# Patient Record
Sex: Female | Born: 1980 | Race: White | Hispanic: Yes | Marital: Married | Smoking: Never smoker
Health system: Southern US, Community
[De-identification: ages and names within clinical notes are randomized; demographics above are authoritative.]

---

## 2005-11-13 ENCOUNTER — Ambulatory Visit (HOSPITAL_COMMUNITY): Admission: RE | Admit: 2005-11-13 | Discharge: 2005-11-13 | Payer: Self-pay | Admitting: Family Medicine

## 2006-03-15 ENCOUNTER — Ambulatory Visit: Payer: Self-pay | Admitting: Obstetrics and Gynecology

## 2006-03-15 ENCOUNTER — Inpatient Hospital Stay (HOSPITAL_COMMUNITY): Admission: AD | Admit: 2006-03-15 | Discharge: 2006-03-17 | Payer: Self-pay | Admitting: Family Medicine

## 2008-09-07 ENCOUNTER — Ambulatory Visit (HOSPITAL_COMMUNITY): Admission: RE | Admit: 2008-09-07 | Discharge: 2008-09-07 | Payer: Self-pay | Admitting: Family Medicine

## 2009-01-24 ENCOUNTER — Ambulatory Visit: Payer: Self-pay | Admitting: Family

## 2009-01-24 ENCOUNTER — Inpatient Hospital Stay (HOSPITAL_COMMUNITY): Admission: AD | Admit: 2009-01-24 | Discharge: 2009-01-26 | Payer: Self-pay | Admitting: Obstetrics & Gynecology

## 2010-06-14 IMAGING — US US OB DETAIL+14 WK
3 series · 14 of 28 positions shown · non-contrast
Comparison: none

OBSTETRICAL ULTRASOUND:
 This ultrasound exam was performed in the [HOSPITAL] Ultrasound Department.  The OB US report was generated in the AS system, and faxed to the ordering physician.  This report is also available in [REDACTED] PACS.

[Series 1: us ob detail +14 wk · 0.14mm/px · 1 of 12 slices shown (1 of 3)]
[im 6/12]
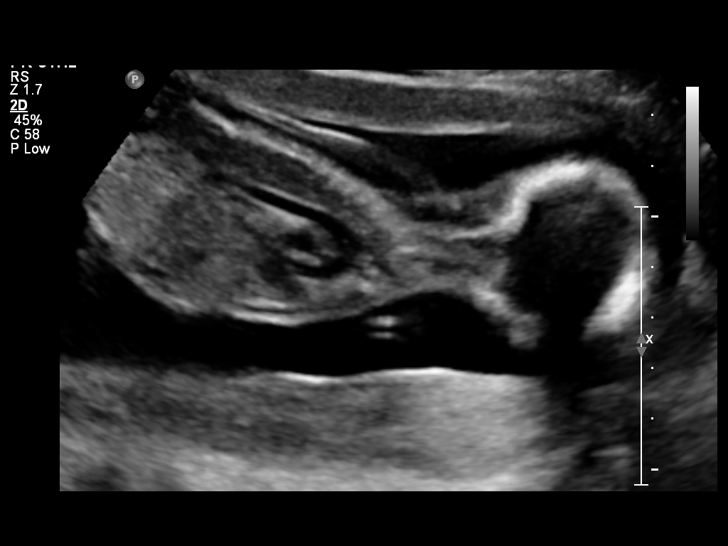

[Series 1: us ob detail +14 wk · 0.16mm/px · 2 of 15 slices shown (2 of 3)]
[im 1/15]
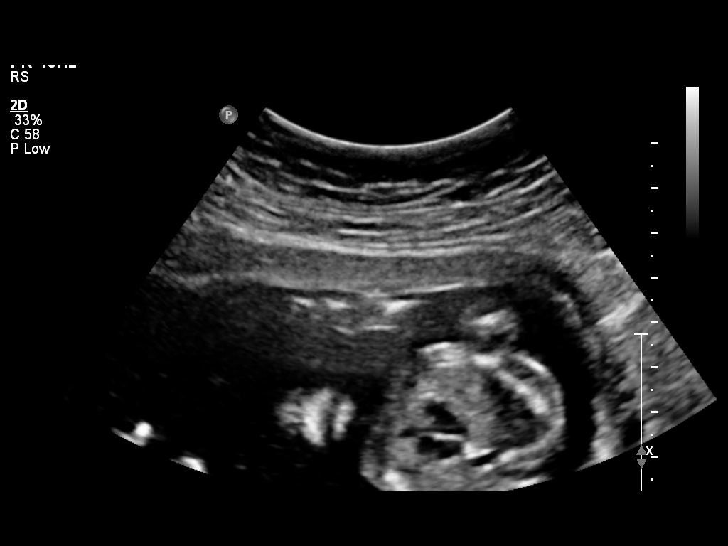
[im 10/15]
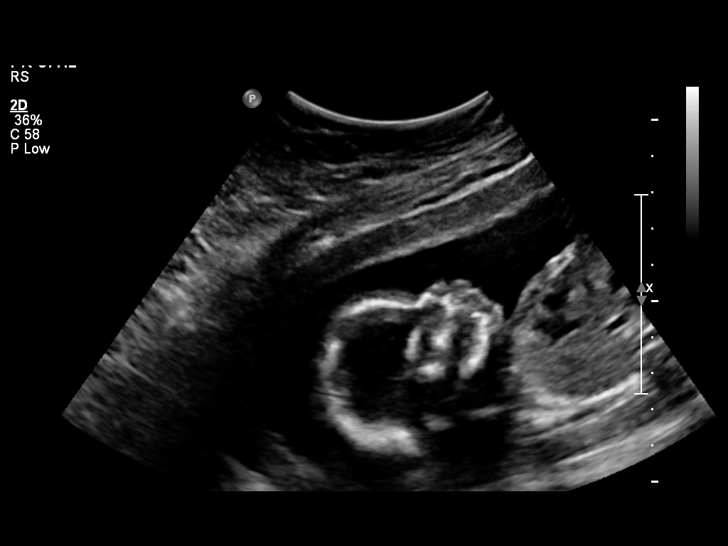

[Series 1: us ob detail +14 wk · 0.24mm/px · 78 acquisitions, 11 frames shown (3 of 3)]
[im 1/78]
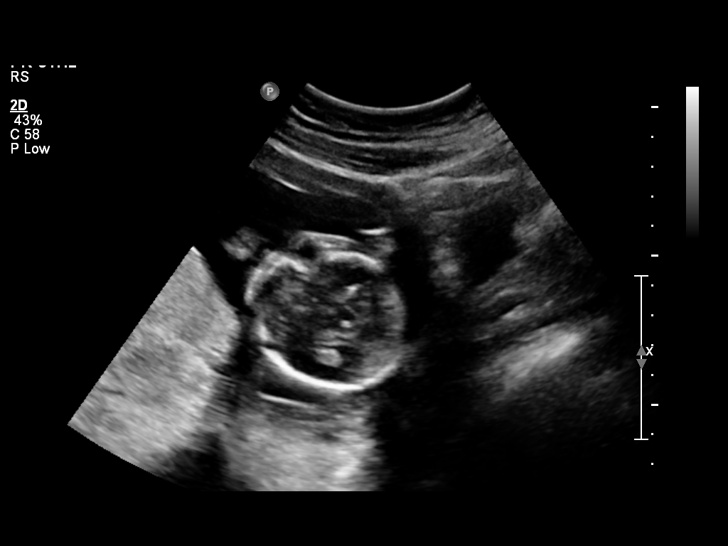
[im 8/78]
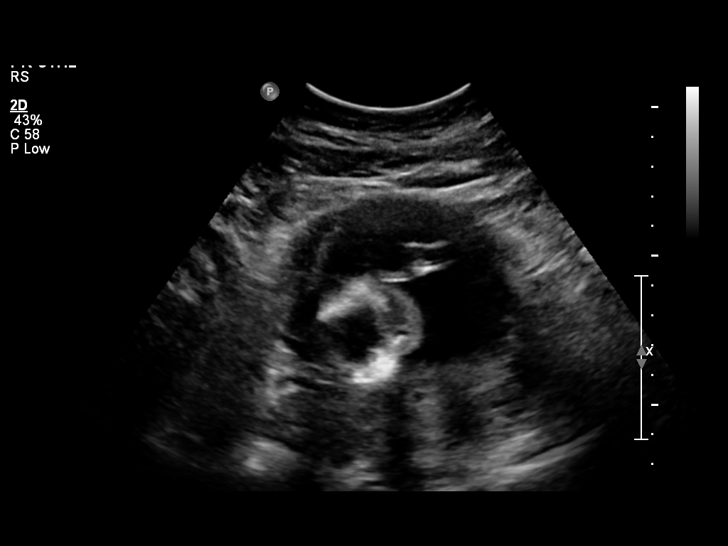
[im 16/78]
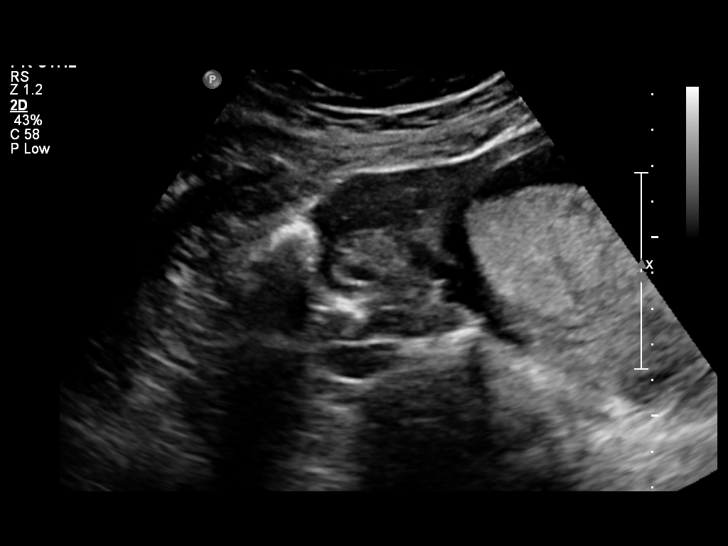
[im 24/78]
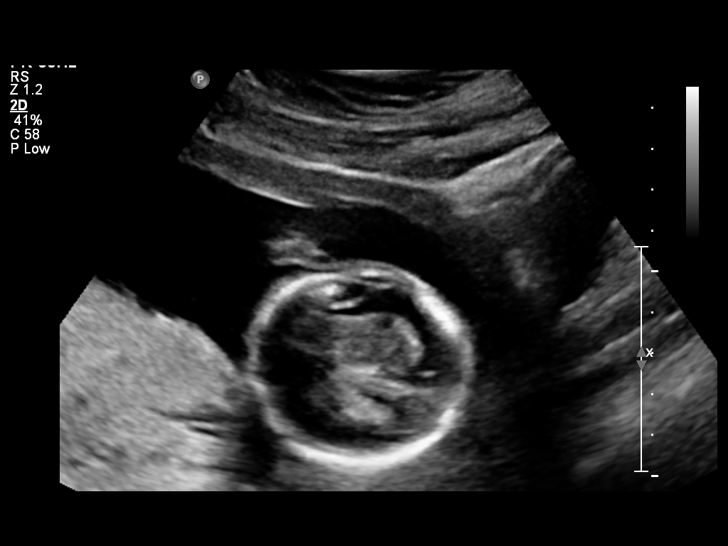
[im 31/78]
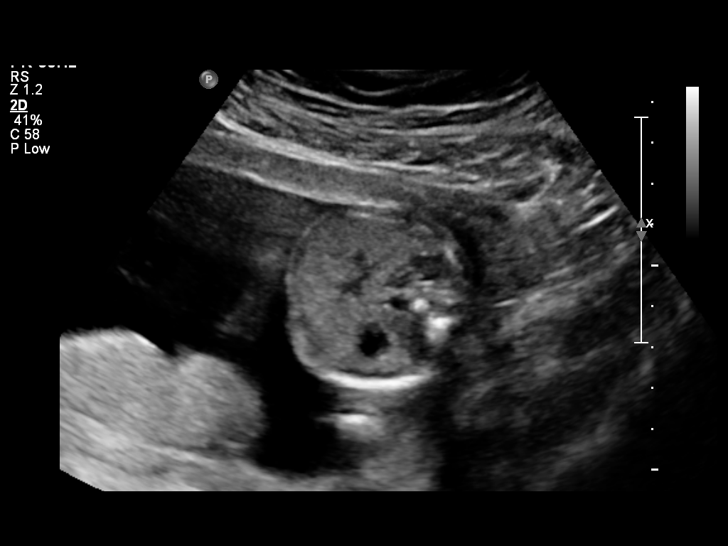
[im 39/78]
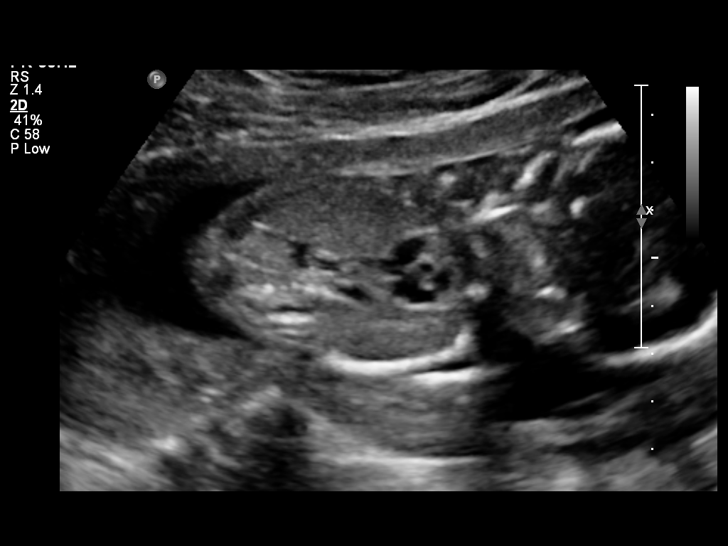
[im 47/78]
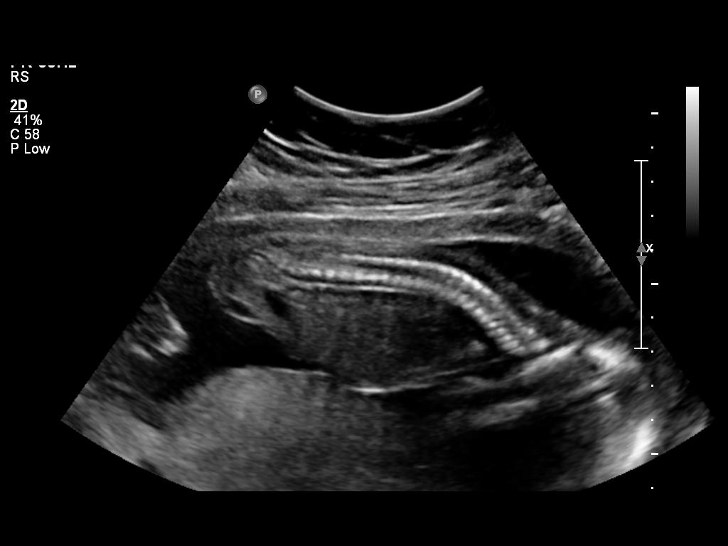
[im 54/78]
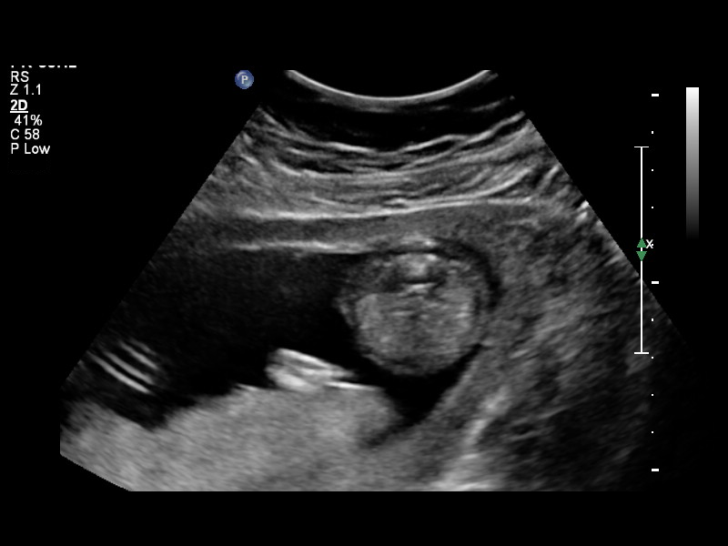
[im 62/78]
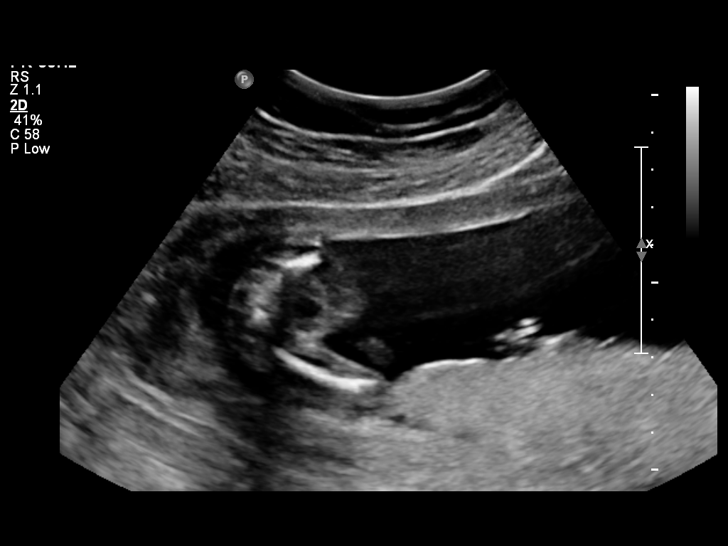
[im 70/78]
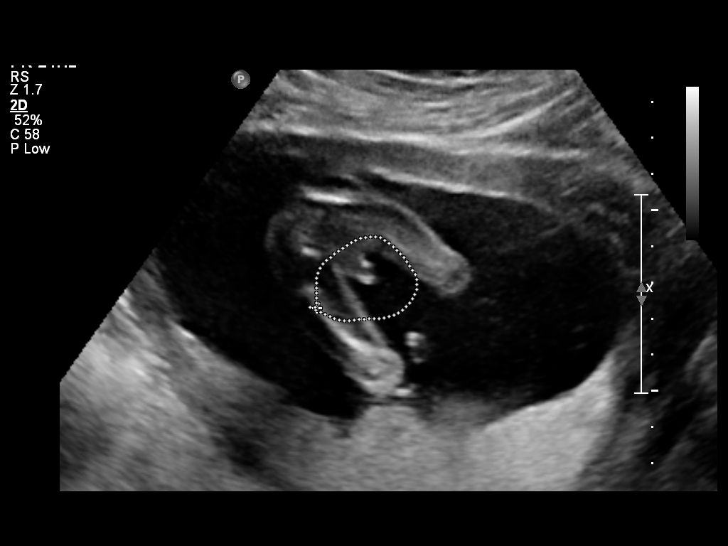
[im 78/78]
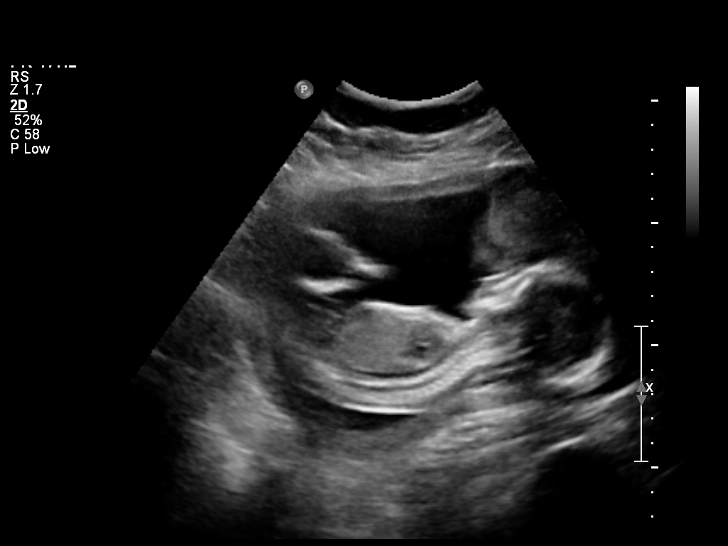

[14 of 28 positions shown; findings below may reference images not displayed]

IMPRESSION: See AS Obstetric US report.

## 2010-06-27 LAB — CBC
HCT: 38.2 % (ref 36.0–46.0)
MCV: 91.4 fL (ref 78.0–100.0)
Platelets: 227 10*3/uL (ref 150–400)
WBC: 8.9 10*3/uL (ref 4.0–10.5)

## 2011-03-27 ENCOUNTER — Ambulatory Visit: Payer: Self-pay

## 2011-03-27 DIAGNOSIS — J029 Acute pharyngitis, unspecified: Secondary | ICD-10-CM

## 2011-03-27 DIAGNOSIS — R509 Fever, unspecified: Secondary | ICD-10-CM

## 2011-12-13 ENCOUNTER — Encounter: Payer: Self-pay | Admitting: Obstetrics & Gynecology

## 2012-01-06 ENCOUNTER — Encounter: Payer: Self-pay | Admitting: Obstetrics & Gynecology

## 2012-01-06 ENCOUNTER — Ambulatory Visit (INDEPENDENT_AMBULATORY_CARE_PROVIDER_SITE_OTHER): Payer: Self-pay | Admitting: Obstetrics & Gynecology

## 2012-01-06 ENCOUNTER — Other Ambulatory Visit (HOSPITAL_COMMUNITY)
Admission: RE | Admit: 2012-01-06 | Discharge: 2012-01-06 | Disposition: A | Discharge status: Home / Self Care | Payer: Self-pay | Source: Ambulatory Visit | Attending: Obstetrics & Gynecology | Admitting: Obstetrics & Gynecology

## 2012-01-06 VITALS — BP 101/65 | HR 62 | Temp 97.7°F | Ht 61.25 in | Wt 157.0 lb

## 2012-01-06 DIAGNOSIS — Z0000 Encounter for general adult medical examination without abnormal findings: Secondary | ICD-10-CM

## 2012-01-06 DIAGNOSIS — N841 Polyp of cervix uteri: Secondary | ICD-10-CM

## 2012-01-06 NOTE — Progress Notes (Signed)
History:  31 y.o. Emily Henderson here today for evaluation and management of cervical polyp seen at [facility], had normal pap smear. No bleeding, no pain or other concerns.  Spanish interpreter present for this encounter.   The following portions of the patient's history were reviewed and updated as appropriate: allergies, current medications, past family history, past medical history, past social history, past surgical history and problem list.  Review of Systems:  Pertinent items are noted in HPI.  Objective:  Physical Exam Blood pressure 101/65, pulse 62, temperature 97.7 F (36.5 C), temperature source Oral, height 5' 1.25" (1.556 m), weight 157 lb (71.215 kg), last menstrual period 12/29/2011. Gen: NAD Abd: Soft, nontender and nondistended Pelvic: Normal appearing external genitalia; normal appearing vaginal mucosa and cervix. 1 cm polypoid lesion seen at external os with narrow stalk..  Normal discharge.  Small uterus, no other palpable masses, no uterine or adnexal tenderness  CERVICAL POLYPECTOMY NOTE Risks of the biopsy including pain, bleeding, infection, inadequate specimen, and need for additional procedures were discussed. The patient stated understanding and agreed to undergo procedure today. Consent was signed,  time out performed.  The patient's cervix was prepped with Betadine.  Ring forceps were used to grasp the polypoid lesion and the polypoid lesions was removed by twisting it off its base.  Tissue sent to pathology for analysis.  Small bleeding was noted and hemostasis was achieved using silver nitrate sticks.  The patient tolerated the procedure well. Post-procedure instructions were given to the patient.   Assessment & Plan:  Will follow up pathology results and manage accordingly. Routine preventative health maintenance measures emphasized

## 2012-01-06 NOTE — Patient Instructions (Signed)
Cuidados preventivos en las mujeres adultas  (Preventive Care for Adults, Female) Un estilo de vida saludable y los cuidados preventivos pueden favorecer la salud y el bienestar. Las guas para conservar la salud para las mujeres incluyen las siguientes prcticas clave:   Un examen fsico de rutina anual es un buen modo de controlar su salud y realizar estudios preventivos. Le da la posibilidad de compartir preocupaciones y conocer el estado de su salud, y que le realicen estudios completos.  Consulte al dentista para realizar un examen de rutina y cuidados preventivos cada 6 meses. Cepllese los dientes al menos dos veces por da y psese el hilo dental al menos una vez por da. Una buena higiene bucal evita caries y enfermedades de las encas.  La frecuencia con que debe hacerse exmenes de la vista depende de la edad, el estado de salud, la historia familiar, el uso de lentes de contacto y otros factores. Siga las recomendaciones del mdico para saber con qu frecuencia debe hacerse exmenes de la vista.  Consuma una dieta saludable. Los alimentos como vegetales, frutas, granos enteros, productos lcteos descremados y protenas magras contienen los nutrientes que usted necesita sin necesidad de consumir muchas caloras. Disminuya el consume de alimentos con alto contenido de grasas slidas, azcar y sal agregadas. Consuma la cantidad de caloras adecuada para usted.Si es necesario, pdale una dieta adecuada al profesional que lo asiste.  La actividad fsica regular es una de las cosas ms importantes que puede hacer por su salud. Los adultos deben hacer al menos 150 minutos de ejercicios de actividad de intensidad moderada (toda actividad que aumente la frecuencia cardaca y lo haga transpirar) cada semana. Adems, la mayora de los adultos necesita ejercicios de fortalecimiento muscular 2  ms das por semana.  Hay que mantener un peso saludable. El ndice de masa corporal (IMC) es una  herramienta que identifica posibles problemas con el peso. Proporciona una estimacin de la grasa corporal basndose en el peso y la altura. El mdico podr determinar si IMC y podr ayudarlo a lograr o mantener un peso saludable.Para los adultos de 20 aos o ms:  Un IMC menor a 18,5 se considera bajo peso.  Un IMC entre 18,5 y 24,9 es normal.  Un IMC entre 25 y 29,9 es sobrepeso.  Un IMC entre 30 o ms es obesidad.  Mantenga un nivel normal de lpidos y colesterol en sangre practicando actividad fsica y minimizando la ingesta de grasas saturadas. Consuma una dieta balanceada y saludable, e incluya variedad de frutas y vegetales. Los anlisis de lpidos y colesterol en sangre deben comenzar a los 20 aos y repetirse cada 5 aos. Si los niveles de colesterol son altos, tiene ms de 50 aos o tiene riesgo elevado de sufrir enfermedades cardacas necesitar controlarse con ms frecuencia.Si tiene niveles elevados de lpidos y colesterol, debe recibir tratamiento con medicamentos, si la dieta y el ejercicio no son efectivos.  Si fuma, consulte con el profesional acerca de las opciones para dejar de hacerlo. Si no lo hace, no comience.  Si est embarazada no beba alcohol. Si est amamantando, beba alcohol con prudencia. Si elige beber alcohol, no se exceda de 1 medida por da. Se considera una medida a 12 onzas (355 ml) de cerveza, 5 onzas (148 ml) de vino, o 1,5 onzas (44 ml) de licor.  Evite el alcohol y el consumo de drogas. No comparta agujas. Pida ayuda si necesita asistencia o instrucciones con respecto a abandonar el consumo de alcohol, cigarrillos o   drogas.  La hipertensin arterial causa enfermedades cardacas y aumenta el riesgo de ictus. Debe controlar su presin arterial al menos cada 1 o 2 aos. La presin arterial elevada que persiste debe tratarse con medicamentos si la prdida de peso y el ejercicio no son efectivos.  Si tiene entre 55 y 79 aos, consulte a su mdico si debe tomar  aspirina para prevenir enfermedades cardacas.  Los anlisis para la diabetes incluyen la toma de una muestra de sangre para controlar el nivel de azcar en la sangre durante el ayuno. Debe hacerlo cada 3 aos despus de los 45 aos si est dentro de su peso normal y sin factores de riesgo para la diabetes. Las pruebas deben comenzar a edades tempranas o llevarse a cabo con ms frecuencia si tiene sobrepeso y al menos 1 factor de riesgo para la diabetes.  Las evaluaciones para detectar el cncer de mama son un mtodo preventivo fundamental para las mujeres. Debe practicar la "autoconciencia de las mamas". Esto significa que debe comprender como es la apariencia normal y como se sienten sus mamas e incluir un autoexamen. Si detecta algn cambio, no importa cun pequeo sea, debe informarlo a su mdico. Las mujeres entre 20 y 30 aos deben hacer un examen clnico de las mamas como parte del examen regular de salud, cada 1 a 3 aos. Despus de los 40 aos deben hacerlo todos los aos. Deben hacerse una mamografa cada ao, comenzando a los 40 aos. Las mujeres con historia familiar de cncer de mama deben hablar con el mdico para hacer un estudio gentico. Las que tienen ms riesgo deben hacerse una ecografa y una mamografa todos los aos.  Un papanicolau se realiza para diagnosticar cncer de cuello de tero. Muestra los cambios celulares en el cuello que pueden transformarse en cncer si no se tratan. El papanicolau es un procedimiento por el que se obtienen clulas de la parte inferior del tero (cuello) y son examinadas.  Las mujeres deben hacerse un papanicolau a partir de los 21 aos.  Entre los 21 y los 29 aos debe repetirse cada dos aos.  Luego de los 30 aos, debe realizarse un Papanicolaou cada tres aos siempre que los 3 estudios anteriores sean normales.  Algunas mujeres sufren problemas mdicos que aumenta la probabilidad de contraer cncer cervical. Consulte a su mdico acerca de estos  problemas. Es muy importante que le informe a su mdico si aparecen nuevos problemas poco despus de su ltimo Papanicolaou. En estos casos, el mdico podr indicar que se realice el Papanicolaou con ms frecuencia.  Estas recomendaciones son las mismas para todas las mujeres hayan recibido o no la vacuna para el VPH (virus del papiloma humano).  Si le han realizado una histerectoma por un problema que no era cncer u otra enfermedad que podra causar cncer, ya no necesitar un Papanicolaou. Sin embargo, si ya no necesita hacerse un Papanicolau, es una buena idea hacerse un examen regularmente para asegurarse de que no hay otros problemas.  Si tiene entre 65 y 70 aos y ha tenido un Papanicolaou normal en los ltimos 10 aos, ya no ser necesario realizarlo. Sin embargo, si ya no necesita hacerse un Papanicolau, es una buena idea hacerse un examen regularmente para asegurarse de que no hay otros problemas.  Si ha recibido un tratamiento para el cncer cervical o para una enfermedad que podra causar cncer, necesitar realizar un Papanicolaou y controles durante al menos 20 aos de concluir el tratamiento.  Si no se ha   hecho el examen con regularidad, debern volver a evaluarse los factores de riesgo (como el tener un nuevo compaero sexual) para determinar si debe volver a realizarse los estudios.  La prueba del VPH es un anlisis adicional que puede usarse para detectar cncer de cuello de tero. Esta prueba busca la presencia del virus que causa los cambios en el cuello. Las clulas que se recolectan durante el papanicolau pueden usarse para el VPH. La prueba para el VPH puede usarse para evaluar a mujeres de ms de 30 aos y debe usarse en mujeres de cualquier edad cuyos resultados del papanicolau no sean claros. Despus de los 30 aos, las mujeres deben hacerse el anlisis para el VPH con la misma frecuencia que el papanicolau.  El cncer colorectal puede detectarse y con fecuencia puede  prevenirse. La mayor parte de los estudios de rutina comienzan a los 50 aos y continan hasta los 75 aos. Sin embargo, el mdico podr aconsejarle que lo haga antes, si tiene factores de riesgo para el cncer de colon. Una vez por ao, el profesional le dar un kit de prueba para hallar sangre oculta en la materia fecal. Utiliza un tubo con una pequea cmara en su extremo para examinar directamente el colon (sigmoidoscopa o colonoscopa), para detectar formas temprana de cncer colorectal. Hable con su mdico si tiene 50 aos, cuando comience con los estudios de rutina. El examen directo del colon debe repetirse cada 5 a 10 aos, hasta los 75 aos, excepto que se encuentren formas tempranas de plipos precancerosos o pequeos bultos.  Se recomienda realizar un anlisis de sangre para detectar hepatitis C a todas las personas nacidas entre 1945 y 1965, y a todo aquel que tenga un riesgo conocido de haber contrado esta enfermedad.  Practique el sexo seguro. Use condones y evite las prcticas sexuales riesgosas para disminuir el contagio de enfermedades de transmisin sexual. Las enfermedades de transmisin sexual son la gonorrea, clamidia, sfilis, tricomonas, herpes, VPH y el virus de inmunodeficiencia humana (VIH). El herpes, el VIH y el VPH son enfermedades virales que no tienen cura. Pueden producir discapacidad, cncer y hasta la muerte. Las mujeres sexualmente activas de 25 aos o menos deben ser evaluadas para detectar clamidia. Las mujeres mayores que tengan mltiples compaeros tambin deben hacerse el anlisis para detectar clamidia. Se recomienda realizar anlisis para detectar otras enfermedades de transmisin sexual si es sexualmente activa y tiene riesgos.  La osteoporosis es una enfermedad en la que los huesos pierden los minerales y la fuerza por el avance de la edad. El resultado pueden ser fracturas graves en los huesos. El riesgo de osteoporosis puede identificarse con una prueba de  densidad sea. Las mujeres de ms de 65 aos y las que tengan riesgos de sufrir fracturas u osteoporosis deben pedir consejo a su mdico. Consulte a su mdico si debe tomar un suplemento de calcio o de vitamina D para reducir el riesgo de osteoporosis.  La menopausia se asocia a sntomas y riesgos fsicos. Se dispone de una terapia de reemplazo hormonal para disminuir los sntomas y los riesgos. Consulte a su mdico para saber si la terapia de reemplazo hormonal es conveniente para usted.  Use una pantalla solar con un factor SPF de 30 o mayor. Aplique pantalla de manera libre y repetida a lo largo del da. Pngase al resguardo del sol cuando la sombra sea ms pequea que usted. Protjase usando mangas y pantalones largos, un sombrero de ala ancha y gafas para el sol todo el ao,   siempre que se encuentre en el exterior.  Una vez por mes hgase un examen de la piel de todo el cuerpo usando un espejo para ver la espalda. nforme al mdico si aparecen nuevos lunares, los que ya estn tienen bordes irregulares, los que sean ms grandes que una goma de lpiz o los que hayan cambiado su forma o color.  Mantngase al da con las vacunas.  Gripe: Debe aplicarse una dosis todos en cada otoo (o invierno). La composicin de la vacuna de la gripe cambia todos los aos, por lo tanto no es suficiente con vacunarse una vez.  Vacuna antineumocccica de polisacridos Debe aplicarse 1  2 dosis si fuma o si sufre ciertas enfermedades crnicas. Necesitar 1 dosis a los 65 aos (o ms) si nunca se ha vacunado.  Vacuna difteria, ttanos, tos convulsa (DTP). Aplquese una dosis de la vacuna DTaP (vacuna contra la tos convulsa para adultos) si es menor de 65 aos, si tiene ms de 65 aos y est en contacto con un beb, es un trabajador de la salud, es una mujer embarazada o simplemente quiere estar protegido de la enfermedad. Luego necesitar un refuerzo de DT cada 10 aos. Consulte con su mdico si no ha recibido al menos  3 dosis de la vacuna contra el ttanos (y la difteria) en algn momento de su vida o tiene una herida profunda o sucia.  VPH: Debe aplicarse esta vacuna si tiene 26 aos o menos. La vacuna se administra en 3 dosis, generalmente durante el curso de 6 meses.  MMR (sarampin, paperas, rubola) Debe aplicarse al menos una dosis de MMR si ha nacido en 1957 o despus. Podra tambin necesitar una segunda dosis.  Antimeningocccica Si tiene entre 19 y 21 aos y es un estudiante universitario de primer ao que vive en una residencia estudiantil, o tiene alguna enfermedad mdica, debe recibir esta vacuna. Podra tambin necesitar dosis de refuerzo.  Herpes zoster (culebrilla). Si tiene 60 aos o ms debe aplicarse esta vacuna ahora.  Varicela Si nunca se vacun o slo recibi una dosis, hable con su mdico para averiguar si necesita aplicarse esta vacuna.  Hepatitis A. Debe aplicarse esta vacuna si tiene un factor de riesgo especfico para contraer una infeccin por el virus de la hepatitis A, o simplemente desea estar protegido contra la enfermedad. La vacuna se administra en 2 dosis, con una diferencia entre 6 y 18 meses.  Hepatitis B. Debe aplicarse esta vacuna si tiene un factor de riesgo especfico para contraer una infeccin por el virus de la hepatitis B, o simplemente desea estar protegido contra la enfermedad. La vacuna se administra en 3 dosis, generalmente durante el curso de 6 meses. Controles preventivos - Frecuencia Edad 19 a 39  Control de la presin arterial.** / Cada 1 a 2 aos.  Control de lpidos y colesterol.** / Cada 5 aos, comenzando a los 20 aos.  Examen clnico de mamas.** / Cada 3 aos en las mujeres entre los 20 y los 30 aos.  Papanicolau.** / Cada 2 aos entre los 21 y los 29 aos. Despus de los 30 aos, y hasta los 65 o 70, con una historia de 3 papanicolau normales consecutivos.  Pruebas para el VPH.** / Cada 3 aos, a partir de los 30 aos, y hasta los 65 o 70, con  una historia de 3 papanicolau normales consecutivos.  Anlisis de sangre para la hepatitis C. ** / Para todo individuo con riesgos conocidos para la hepatitis C.  Autoexamen de piel. /   Todos los meses.  Vacuna contra la gripe.** / Todos los aos.  Vacuna antineumocccica de polisacridos.** / Debe aplicarse 1  2 dosis si fuma o si sufre ciertas enfermedades crnicas.  Vacuna difteria, ttanos, tos convulsa (Tdap, Td). / Una dosis nica de vacuna Tdap. Luego necesitar un refuerzo de DT cada 10 aos.  Vacuna contra el VPH. / 3 dosis en el curso de 6 meses, si tiene 26 aos o menos.  MMR (sarampin, paperas, rubola). / Debe aplicarse al menos una dosis de MMR si ha nacido en 1957 o despus. Podra tambin necesitar una segunda dosis.  Vacunacin antimeningocccica. / Si tiene entre 19 y 21 aos y es un estudiante universitario de primer ao que vive en una residencia estudiantil, o tiene alguna enfermedad mdica, debe recibir esta vacuna. Podra tambin necesitar dosis de refuerzo.  Vacuna contra la varicela.** / Consltelo con el mdico.  Vacuna contra la hepatitis A.** / Consltelo con el mdico. 2 dosis, con un intervalo entre 6 a 18 meses.  Vacuna contra la hepatitis B.** / Consltelo con el mdico. 3 dosis en el curso de 6 meses. Edad 40 a 64  Control de la presin arterial.** / Cada 1 a 2 aos.  Control de lpidos y colesterol. **/ Cada 5 aos, comenzando a los 20 aos.  Examen clnico de mamas.** / Todos los aos despus de los 40 aos.  Mamografa.** / Una vez por ao a partir de los 40 aos y continuando siempre que tenga buena salud. Consulte con el mdico.  Papanicolau.** / Cada 3 aos despus de los 30 aos, y hasta los 65 o 70, con una historia de 3 papanicolau normales consecutivos.  Pruebas para el VPH.** / Cada 3 aos despus de los 30 aos, y hasta los 65 o 70, con una historia de 3 papanicolau normales consecutivos.  Prueba de sangre oculta en materia fecal. /  Cada ao comenzando a los 50 aos continuando hasta los 75. No tendr que hacerlo si se ha hecho una colonoscopa cada 10 aos.  Sigmoidoscopa flexible** o colonoscopa.** / Cada 5 aos para la sigmoidoscopa flexible o cada 10 aos para la colonoscopa, comenzando a los 50 aos y continuando hasta los 75 aos.  Anlisis de sangre para la hepatitis C. ** / Para todas las personas nacidas entre 1945 y 1965 y a todo aquel que tenga un riesgo conocido para la hepatitis C.  Autoexamen de piel. / Todos los meses.  Vacuna contra la gripe.** / Todos los aos.  Vacuna antineumocccica de polisacridos.** / Debe aplicarse 1  2 dosis si fuma o si sufre ciertas enfermedades crnicas.  Vacuna difteria, ttanos, tos convulsa (Tdap, Td). / Una dosis nica de vacuna Tdap. Luego necesitar un refuerzo de DT cada 10 aos.  MMR (sarampin, paperas, rubola). / Debe aplicarse al menos una dosis de MMR si ha nacido en 1957 o despus. Podra tambin necesitar una segunda dosis.  Vacuna contra la varicela.**/ Consltelo con el mdico.  Vacunacin antimeningocccica. / Consltelo con el mdico.  Vacuna contra la hepatitis A.**/ Consltelo con el mdico. 2 dosis, con un intervalo entre 6 a 18 meses.  Vacuna contra la hepatitis B.** / Consltelo con el mdico. 3 dosis en el curso de 6 meses. Edad 65 o ms  Control de la presin arterial.** / Cada 1 a 2 aos.  Control de lpidos y colesterol. **/ Cada 5 aos, comenzando a los 20 aos.  Examen clnico de mamas.** / Todos los aos despus de los 40   aos.  Mamografa.** / Una vez por ao a partir de los 40 aos y continuando siempre que tenga buena salud. Consulte con el mdico.  Papanicolau. ** / Cada 3 aos despus de los 30 aos, y hasta los 65 o 70, con una historia de 3 papanicolau normales consecutivos. Las pruebas pueden detenerse entre los 65 y los 70 aos, si tiene 3 papanicolau consecutivos normales y no tuvo un papanicoalu ni prueba de VPH anormales  en los ltimos 10 aos.  Pruebas para el VPH.** / Cada 3 aos despus de los 30 aos, y hasta los 65 o 70, con una historia de 3 papanicolau normales consecutivos. Las pruebas pueden detenerse entre los 65 y los 70 aos, si tiene 3 papanicolau consecutivos normales y no tuvo un papanicoalu ni prueba de VPH anormales en los ltimos 10 aos.  Prueba de sangre oculta en materia fecal. / Cada ao comenzando a los 50 aos continuando hasta los 75. No tendr que hacerlo si se ha hecho una colonoscopa cada 10 aos.  Sigmoidoscopa flexible** o colonoscopa.** / Cada 5 aos para la sigmoidoscopa flexible o cada 10 aos para la colonoscopa, comenzando a los 50 aos y continuando hasta los 75 aos.  Anlisis de sangre para la hepatitis C. ** / Para todas las personas nacidas entre 1945 y 1965 y a todo aquel que tenga un riesgo conocido para la hepatitis C.  Pruebas para la osteoporosis.** / Por nica vez en mujeres de ms de 65 aos que tengan riesgo de fracturas u osteoporosis.  Autoexamen de piel. / Todos los meses.  Vacuna contra la gripe.** / Todos los aos.  Vacuna antineumocccica de polisacridos.** / Necesitar 1 dosis a los 65 aos (o ms) si nunca se ha vacunado.  Vacuna difteria, ttanos, tos convulsa (Tdap, Td). / Aplquese una dosis de la vacuna DTaP (vacuna contra la tos convulsa para adultos) si tiene ms de 65 aos y est en contacto con un beb, es un trabajador de la salud, es una mujer embarazada o simplemente quiere estar protegido de la enfermedad. Luego necesitar un refuerzo de DT cada 10 aos.  Vacuna contra la varicela.**/ Consltelo con el mdico.  Vacunacin antimeningocccica.** / Consltelo con el mdico.  Vacuna contra la hepatitis A.** / Consltelo con el mdico. 2 dosis, con un intervalo entre 6 a 18 meses.  Vacuna contra la hepatitis B.** / Consulte con el mdico. 3 dosis en el curso de 6 meses. **La historia familiar y personal de riesgos y enfermedades puede  cambiar las recomendaciones del mdico. Document Released: 12/19/2004 Document Revised: 06/03/2011 [employer] Patient Information 2013 [employer].  

## 2012-01-09 ENCOUNTER — Telehealth: Payer: Self-pay | Admitting: *Deleted

## 2012-01-09 NOTE — Telephone Encounter (Signed)
Message copied by Gerome Apley on Thu Jan 09, 2012  1:29 PM ------      Message from: Jaynie Collins A      Created: Thu Jan 09, 2012 12:31 PM       Benign cervical polyp. Please call to inform patient of results. She needs a [place] interpreter.

## 2012-01-09 NOTE — Telephone Encounter (Signed)
Called patient informed her of normal results.

## 2014-01-24 ENCOUNTER — Encounter: Payer: Self-pay | Admitting: Obstetrics & Gynecology

## 2022-07-10 ENCOUNTER — Other Ambulatory Visit: Payer: Self-pay | Admitting: Obstetrics and Gynecology

## 2022-07-10 DIAGNOSIS — Z1231 Encounter for screening mammogram for malignant neoplasm of breast: Secondary | ICD-10-CM

## 2022-08-12 ENCOUNTER — Other Ambulatory Visit: Payer: Self-pay

## 2022-08-15 ENCOUNTER — Ambulatory Visit
Admission: RE | Admit: 2022-08-15 | Discharge: 2022-08-15 | Disposition: A | Discharge status: Home / Self Care | Payer: No Typology Code available for payment source | Source: Ambulatory Visit | Attending: Obstetrics and Gynecology | Admitting: Obstetrics and Gynecology

## 2022-08-15 ENCOUNTER — Ambulatory Visit: Payer: Self-pay | Admitting: Hematology and Oncology

## 2022-08-15 ENCOUNTER — Telehealth: Payer: Self-pay

## 2022-08-15 VITALS — BP 101/63 | Wt 200.0 lb

## 2022-08-15 DIAGNOSIS — Z1231 Encounter for screening mammogram for malignant neoplasm of breast: Secondary | ICD-10-CM

## 2022-08-15 NOTE — Patient Instructions (Signed)
Taught Emily Henderson about self breast awareness and gave educational materials to take home. Patient did not need a Pap smear today due to last Pap smear was in 2022 per patient. Let her know [facility] will cover Pap smears every 5 years unless has a history of abnormal Pap smears. Referred patient to the [facility] for screening mammogram. Appointment scheduled for 08/16/2022. Patient aware of appointment and will be there. Let patient know will follow up with her within the next couple weeks with results. Emily Henderson verbalized understanding.  Pascal Lux, NP 9:16 AM

## 2022-08-15 NOTE — Progress Notes (Signed)
Ms. Emily Henderson is a 42 y.o. female who presents to [facility] clinic today with no complaints.    Pap Smear: Pap not smear completed today. Last Pap smear was 2022 and was normal. Per patient has no history of an abnormal Pap smear. Last Pap smear result is not available in Epic.   Physical exam: Breasts Breasts symmetrical. No skin abnormalities bilateral breasts. No nipple retraction bilateral breasts. No nipple discharge bilateral breasts. No lymphadenopathy. No lumps palpated bilateral breasts.       Pelvic/Bimanual Pap is not indicated today    Smoking History: Patient has never smoked and was not referred to quit line.    Patient Navigation: Patient education provided. Access to services provided for patient through [facility] program. Kathyrn Lass interpreter provided. No transportation provided   Colorectal Cancer Screening: Per patient has never had colonoscopy completed No complaints today.    Breast and Cervical Cancer Risk Assessment: Patient does not have family history of breast cancer, known genetic mutations, or radiation treatment to the chest before age 58. Patient does not have history of cervical dysplasia, immunocompromised, or DES exposure in-utero.  Risk Scores as of 08/15/2022     Emily Henderson           5-year 0.68 %   Lifetime 11 %   This patient is [place]/Latina but has no documented birth country, so the [place] model used data from [place] patients to calculate their risk score. Document a birth country in the Demographics activity for a more accurate score.         Last calculated by Caprice Red, CMA on 08/15/2022 at  8:55 AM        A: [facility] exam without pap smear No complaints with benign exam.   P: Referred patient to [facility] for a screening mammogram. Appointment scheduled 08/16/22.  Pascal Lux, NP 08/15/2022 9:09 AM

## 2022-08-15 NOTE — Telephone Encounter (Signed)
I have called [facility] and left a message requesting pt most recent PAP result and left a message with the medical records department requesting a copy of most recent PAP result. I have also provided our fax number to send it to.

## 2022-08-21 ENCOUNTER — Other Ambulatory Visit: Payer: Self-pay | Admitting: Obstetrics and Gynecology

## 2022-08-21 DIAGNOSIS — R928 Other abnormal and inconclusive findings on diagnostic imaging of breast: Secondary | ICD-10-CM

## 2022-09-03 ENCOUNTER — Ambulatory Visit
Admission: RE | Admit: 2022-09-03 | Discharge: 2022-09-03 | Disposition: A | Discharge status: Home / Self Care | Payer: No Typology Code available for payment source | Source: Ambulatory Visit | Attending: Obstetrics and Gynecology | Admitting: Obstetrics and Gynecology

## 2022-09-03 DIAGNOSIS — R928 Other abnormal and inconclusive findings on diagnostic imaging of breast: Secondary | ICD-10-CM

## 2022-09-12 NOTE — Telephone Encounter (Signed)
Records have been received.

## 2022-09-16 ENCOUNTER — Other Ambulatory Visit: Payer: Self-pay

## 2023-10-07 ENCOUNTER — Telehealth: Payer: Self-pay

## 2023-10-07 NOTE — Telephone Encounter (Signed)
 Patient telephoned, used interpreter, Francee Sprung. Confirmed patient's address and will mail a mammogram scholarship application. [facility]

## 2023-10-28 ENCOUNTER — Other Ambulatory Visit: Payer: Self-pay | Admitting: Family

## 2023-10-28 DIAGNOSIS — Z1231 Encounter for screening mammogram for malignant neoplasm of breast: Secondary | ICD-10-CM

## 2024-01-13 ENCOUNTER — Ambulatory Visit
Admission: RE | Admit: 2024-01-13 | Discharge: 2024-01-13 | Disposition: A | Discharge status: Home / Self Care | Payer: Self-pay | Source: Ambulatory Visit | Attending: Family | Admitting: Family

## 2024-01-13 DIAGNOSIS — Z1231 Encounter for screening mammogram for malignant neoplasm of breast: Secondary | ICD-10-CM
